# Patient Record
Sex: Female | Born: 1995
Health system: Southern US, Community
[De-identification: ages and names within clinical notes are randomized; demographics above are authoritative.]

## PROBLEM LIST (undated history)

## (undated) DIAGNOSIS — Z9109 Other allergy status, other than to drugs and biological substances: Secondary | ICD-10-CM

## (undated) DIAGNOSIS — J020 Streptococcal pharyngitis: Secondary | ICD-10-CM

## (undated) HISTORY — PX: WISDOM TOOTH EXTRACTION: SHX21

---
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
## 2017-02-21 ENCOUNTER — Ambulatory Visit
Admission: EM | Admit: 2017-02-21 | Discharge: 2017-02-21 | Disposition: A | Discharge status: Home / Self Care | Payer: Medicaid Other | Attending: Family Medicine | Admitting: Family Medicine

## 2017-02-21 ENCOUNTER — Ambulatory Visit: Payer: Medicaid Other

## 2017-02-21 DIAGNOSIS — Z7722 Contact with and (suspected) exposure to environmental tobacco smoke (acute) (chronic): Secondary | ICD-10-CM | POA: Diagnosis not present

## 2017-02-21 DIAGNOSIS — S6710XA Crushing injury of unspecified finger(s), initial encounter: Secondary | ICD-10-CM | POA: Diagnosis not present

## 2017-02-21 DIAGNOSIS — W230XXA Caught, crushed, jammed, or pinched between moving objects, initial encounter: Secondary | ICD-10-CM | POA: Insufficient documentation

## 2017-02-21 DIAGNOSIS — Z79899 Other long term (current) drug therapy: Secondary | ICD-10-CM | POA: Diagnosis not present

## 2017-02-21 DIAGNOSIS — Z91013 Allergy to seafood: Secondary | ICD-10-CM | POA: Diagnosis not present

## 2017-02-21 DIAGNOSIS — S6992XA Unspecified injury of left wrist, hand and finger(s), initial encounter: Secondary | ICD-10-CM | POA: Diagnosis not present

## 2017-02-21 DIAGNOSIS — Z888 Allergy status to other drugs, medicaments and biological substances status: Secondary | ICD-10-CM | POA: Diagnosis not present

## 2017-02-21 NOTE — ED Triage Notes (Signed)
Pt reports left hand 1st finger was knicked yesterday with a car door. Able to move finger but with pain. No significant swelling noted, no open area noted. Pain 5/10
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
## 2017-04-30 ENCOUNTER — Ambulatory Visit
Admission: EM | Admit: 2017-04-30 | Discharge: 2017-04-30 | Disposition: A | Discharge status: Home / Self Care | Payer: Medicaid Other | Attending: Family Medicine | Admitting: Family Medicine

## 2017-04-30 ENCOUNTER — Encounter: Payer: Self-pay | Admitting: Gynecology

## 2017-04-30 DIAGNOSIS — R05 Cough: Secondary | ICD-10-CM

## 2017-04-30 DIAGNOSIS — J019 Acute sinusitis, unspecified: Secondary | ICD-10-CM | POA: Diagnosis not present

## 2017-04-30 MED ORDER — FEXOFENADINE-PSEUDOEPHED ER 180-240 MG PO TB24: 1.0000 | ORAL_TABLET | Freq: Every day | ORAL | 0 refills | Status: DC

## 2017-04-30 MED ORDER — AMOXICILLIN-POT CLAVULANATE 875-125 MG PO TABS: 1.0000 | ORAL_TABLET | Freq: Two times a day (BID) | ORAL | 0 refills | Status: DC

## 2017-04-30 NOTE — ED Triage Notes (Signed)
Patient c/o sinus pressure x 3 weeks. Patient also stated have eaten a sandwich x yesterday which upset her stomach .

## 2017-04-30 NOTE — ED Provider Notes (Signed)
[facility]    CSN: [id number] Arrival date & time: 04/30/17  1000     History   Chief Complaint Chief Complaint  Patient presents with  . Facial Pain    HPI Jodi Berry is a 21 y.o. female.   Patient's here because of 3 week history of nasal congestion coughing. She states green mucus is coming from her nose now. Past history she's that was into the extraction she's had no drug allergies but she is allergic to shrimp bee strigs she has lactose intolerance. She states she's having some abdominal discomfort as well but she does admit to drinking a [place] yesterday while she is tall prescription she thought she needed some caffeine. Past smoker history nonsignificant she does not smoke though she is exposed to smoke from her mother. No pertinent family medical history relevant for today's visit.   The history is provided by the patient. No language interpreter was used.    Past Medical History:  Diagnosis Date  . Environmental allergies   . Strep throat     There are no active problems to display for this patient.   Past Surgical History:  Procedure Laterality Date  . WISDOM TOOTH EXTRACTION      OB History    No data available       Home Medications    Prior to Admission medications   Medication Sig Start Date End Date Taking? Authorizing Provider  amoxicillin-clavulanate (AUGMENTIN) 875-125 MG tablet Take 1 tablet by mouth 2 (two) times daily. 04/30/17   Hassan RowanWade, Iley Deignan, MD  fexofenadine-pseudoephedrine (ALLEGRA-D ALLERGY & CONGESTION) 180-240 MG 24 hr tablet Take 1 tablet by mouth daily. 04/30/17   Hassan RowanWade, Jak Haggar, MD  loratadine (CLARITIN) 10 MG tablet Take 10 mg by mouth daily.    [provider]  oseltamivir (TAMIFLU) 75 MG capsule Take 1 capsule (75 mg total) by mouth 2 (two) times daily. 11/14/16   Payton Mccallumonty, [place], MD    Family History No family history on file.  Social History Social History  Substance Use Topics  . Smoking status: Passive  Smoke Exposure - Never Smoker  . Smokeless tobacco: Never Used  . Alcohol use No     Allergies   Bee venom and Shrimp [shellfish allergy]   Review of Systems Review of Systems  HENT: Positive for congestion, rhinorrhea, sinus pain and sinus pressure.   Respiratory: Positive for cough.   Gastrointestinal: Positive for abdominal distention.  Hematological: Adenopathy:    All other systems reviewed and are negative.    Physical Exam Triage Vital Signs ED Triage Vitals  Enc Vitals Group     BP 04/30/17 1027 111/62     Pulse Rate 04/30/17 1027 65     Resp 04/30/17 1027 16     Temp 04/30/17 1027 98.5 F (36.9 C)     Temp Source 04/30/17 1027 Oral     SpO2 04/30/17 1027 100 %     Weight 04/30/17 1024 178 lb (80.7 kg)     Height 04/30/17 1024 5' (1.524 m)     Head Circumference --      Peak Flow --      Pain Score 04/30/17 1025 2     Pain Loc --      Pain Edu? --      Excl. in GC? --    No data found.   Updated Vital Signs BP 111/62 (BP Location: Left Arm)   Pulse 65   Temp 98.5 F (36.9 C) (Oral)  Resp 16   Ht 5' (1.524 m)   Wt 178 lb (80.7 kg)   LMP 04/23/2017   SpO2 100%   BMI 34.76 kg/m   Visual Acuity Right Eye Distance:   Left Eye Distance:   Bilateral Distance:    Right Eye Near:   Left Eye Near:    Bilateral Near:     Physical Exam  Constitutional: She is oriented to person, place, and time. She appears well-developed and well-nourished.  HENT:  Head: Normocephalic and atraumatic.  Right Ear: External ear normal.  Left Ear: External ear normal.  Eyes: Pupils are equal, round, and reactive to light.  Neck: Normal range of motion. Neck supple. No thyromegaly present.  Cardiovascular: Normal rate and regular rhythm.   Pulmonary/Chest: Effort normal.  Musculoskeletal: Normal range of motion.  Neurological: She is alert and oriented to person, place, and time.  Skin: Skin is warm. She is not diaphoretic.  Psychiatric: She has a normal mood  and affect.  Vitals reviewed.    UC Treatments / Results  Labs (all labs ordered are listed, but only abnormal results are displayed) Labs Reviewed - No data to display  EKG  EKG Interpretation None       Radiology No results found.  Procedures Procedures (including critical care time)  Medications Ordered in UC Medications - No data to display   Initial Impression / Assessment and Plan / UC Course  I have reviewed the triage vital signs and the nursing notes.  Pertinent labs & imaging results that were available during my care of the patient were reviewed by me and considered in my medical decision making (see chart for details).     Since she admits to drinking milk and she is lactose intolerance we'll can do anything about the GI discomfort she is having now other than avoiding milk in the future. For the sinusitis we'll treat with Augmentin 875 one tablet twice daily Allegra-D 1 tablet daily. She needs no distention of medication she goes back to work and that was done as well Final Clinical Impressions(s) / UC Diagnoses   Final diagnoses:  Acute non-recurrent sinusitis, unspecified location    New Prescriptions New Prescriptions   AMOXICILLIN-CLAVULANATE (AUGMENTIN) 875-125 MG TABLET    Take 1 tablet by mouth 2 (two) times daily.   FEXOFENADINE-PSEUDOEPHEDRINE (ALLEGRA-D ALLERGY & CONGESTION) 180-240 MG 24 HR TABLET    Take 1 tablet by mouth daily.     Hassan Rowan, MD 04/30/17 [phone]

## 2017-12-11 ENCOUNTER — Other Ambulatory Visit: Payer: Self-pay

## 2017-12-11 ENCOUNTER — Ambulatory Visit
Admission: EM | Admit: 2017-12-11 | Discharge: 2017-12-11 | Disposition: A | Discharge status: Home / Self Care | Payer: 59 | Attending: Family Medicine | Admitting: Family Medicine

## 2017-12-11 DIAGNOSIS — R110 Nausea: Secondary | ICD-10-CM | POA: Diagnosis not present

## 2017-12-11 DIAGNOSIS — R197 Diarrhea, unspecified: Secondary | ICD-10-CM | POA: Diagnosis not present

## 2017-12-11 LAB — URINALYSIS, COMPLETE (UACMP) WITH MICROSCOPIC
BILIRUBIN URINE: NEGATIVE
Glucose, UA: NEGATIVE mg/dL
Hgb urine dipstick: NEGATIVE
Ketones, ur: NEGATIVE mg/dL
Nitrite: NEGATIVE
PROTEIN: NEGATIVE mg/dL
Specific Gravity, Urine: 1.02 (ref 1.005–1.030)
pH: 7 (ref 5.0–8.0)

## 2017-12-11 LAB — PREGNANCY, URINE: Preg Test, Ur: NEGATIVE

## 2017-12-11 MED ORDER — ONDANSETRON 4 MG PO TBDP: 4.0000 mg | ORAL_TABLET | Freq: Three times a day (TID) | ORAL | 0 refills | Status: DC | PRN

## 2017-12-11 MED ORDER — METRONIDAZOLE 500 MG PO TABS: 2000.0000 mg | ORAL_TABLET | Freq: Every day | ORAL | 0 refills | Status: DC

## 2017-12-11 NOTE — ED Provider Notes (Signed)
[facility]  CSN: [id number] Arrival date & time: 12/11/17  0947  History   Chief Complaint Chief Complaint  Patient presents with  . Nausea  . Diarrhea   HPI  22 year old female presents with nausea, diarrhea, abdominal pain.  Started on Saturday.  She states that she has had epigastric/left upper quadrant pain.  She subsequently developed nausea and diarrhea on Sunday.  Associated dizziness.  No fevers or chills.  She is been able to eat but feels like she has had a decreased appetite.  She ate a gravy biscuit this morning without difficulty.  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  No known exacerbating relieving factors.  No reports of urinary symptoms.  She states that her last menses was at the end of last month.  No other associated symptoms.  No other complaints at this time.  Past Medical History:  Diagnosis Date  . Environmental allergies   . Strep throat    Past Surgical History:  Procedure Laterality Date  . WISDOM TOOTH EXTRACTION     OB History    No data available     Home Medications    Prior to Admission medications   Medication Sig Start Date End Date Taking? Authorizing Provider  metroNIDAZOLE (FLAGYL) 500 MG tablet Take 4 tablets (2,000 mg total) by mouth daily. 12/11/17   Tommie Samsook, Demauri Advincula G, DO  ondansetron (ZOFRAN-ODT) 4 MG disintegrating tablet Take 1 tablet (4 mg total) by mouth every 8 (eight) hours as needed for nausea or vomiting. 12/11/17   Tommie Samsook, Jazline Cumbee G, DO   Family History History reviewed. No pertinent family history.  Social History Social History   Tobacco Use  . Smoking status: Passive Smoke Exposure - Never Smoker  . Smokeless tobacco: Never Used  Substance Use Topics  . Alcohol use: No  . Drug use: No     Allergies   Bee venom and Shrimp [shellfish allergy]   Review of Systems Review of Systems  Constitutional: Negative.   Gastrointestinal: Positive for abdominal pain, diarrhea and nausea. Negative for vomiting.   Physical  Exam Triage Vital Signs ED Triage Vitals  Enc Vitals Group     BP 12/11/17 1001 (!) 144/64     Pulse Rate 12/11/17 1001 88     Resp 12/11/17 1001 18     Temp 12/11/17 1001 98.6 F (37 C)     Temp Source 12/11/17 1001 Oral     SpO2 12/11/17 1001 100 %     Weight 12/11/17 1002 184 lb (83.5 kg)     Height 12/11/17 1002 5' (1.524 m)     Head Circumference --      Peak Flow --      Pain Score 12/11/17 1002 4     Pain Loc --      Pain Edu? --      Excl. in GC? --    Updated Vital Signs BP (!) 144/64 (BP Location: Left Arm)   Pulse 88   Temp 98.6 F (37 C) (Oral)   Resp 18   Ht 5' (1.524 m)   Wt 184 lb (83.5 kg)   LMP 11/14/2017   SpO2 100%   BMI 35.94 kg/m     Physical Exam  Constitutional: She is oriented to person, place, and time. She appears well-developed. No distress.  Cardiovascular: Normal rate and regular rhythm.  Pulmonary/Chest: Effort normal and breath sounds normal. She has no wheezes. She has no rales.  Abdominal:  Soft, nondistended.  Obese.  Mildly tender in the epigastric region.  Neurological: She is alert and oriented to person, place, and time.  Psychiatric: She has a normal mood and affect. Her behavior is normal.  Nursing note and vitals reviewed.  UC Treatments / Results  Labs (all labs ordered are listed, but only abnormal results are displayed) Labs Reviewed  URINALYSIS, COMPLETE (UACMP) WITH MICROSCOPIC - Abnormal; Notable for the following components:      Result Value   Leukocytes, UA TRACE (*)    Squamous Epithelial / LPF 6-30 (*)    Bacteria, UA RARE (*)    All other components within normal limits  PREGNANCY, URINE    EKG  EKG Interpretation None       Radiology No results found.  Procedures Procedures (including critical care time)  Medications Ordered in UC Medications - No data to display   Initial Impression / Assessment and Plan / UC Course  I have reviewed the triage vital signs and the nursing  notes.  Pertinent labs & imaging results that were available during my care of the patient were reviewed by me and considered in my medical decision making (see chart for details).     22 year old female presents with a likely viral gastrointestinal infection.  Advised supportive care.  Zofran for nausea.  Additionally, patient was noted to have Trichomonas in her urine.  I confirmed this with both my nurse and the laboratory staff.  Nurse indicated that the specimen was her specimen.  I looked at the specimen myself and noted trichomonas.  I discussed this with the patient and she denies being sexually active.  I informed her that this is a sexual transmitted infection.  She continued to deny sexual activity.  Nevertheless, I placed her on Flagyl.  Final Clinical Impressions(s) / UC Diagnoses   Final diagnoses:  Nausea  Diarrhea, unspecified type    ED Discharge Orders        Ordered    metroNIDAZOLE (FLAGYL) 500 MG tablet  Daily     12/11/17 1055    ondansetron (ZOFRAN-ODT) 4 MG disintegrating tablet  Every 8 hours PRN     12/11/17 1055     Controlled Substance Prescriptions [place] Controlled Substance Registry consulted? Not Applicable   Tommie Sams, DO 12/11/17 1109

## 2017-12-11 NOTE — Discharge Instructions (Signed)
Rest, fluids.  Zofran for nausea.  Take the antibiotic as prescribed.  Take care  Dr. Adriana Simasook

## 2017-12-11 NOTE — ED Triage Notes (Signed)
Diarrhea and left sided abd pain started Saturday night. Started feeling nauseated yesterday. No emesis.

## 2017-12-12 ENCOUNTER — Ambulatory Visit (INDEPENDENT_AMBULATORY_CARE_PROVIDER_SITE_OTHER): Payer: 59 | Admitting: Family Medicine

## 2017-12-12 ENCOUNTER — Encounter: Payer: Self-pay | Admitting: Family Medicine

## 2017-12-12 ENCOUNTER — Other Ambulatory Visit
Admission: RE | Admit: 2017-12-12 | Discharge: 2017-12-12 | Disposition: A | Discharge status: Home / Self Care | Payer: 59 | Source: Ambulatory Visit | Attending: Family Medicine | Admitting: Family Medicine

## 2017-12-12 VITALS — BP 112/76 | HR 99 | Resp 16 | Ht 60.0 in | Wt 181.4 lb

## 2017-12-12 DIAGNOSIS — Z7689 Persons encountering health services in other specified circumstances: Secondary | ICD-10-CM | POA: Diagnosis not present

## 2017-12-12 DIAGNOSIS — N898 Other specified noninflammatory disorders of vagina: Secondary | ICD-10-CM

## 2017-12-12 LAB — URINALYSIS, COMPLETE (UACMP) WITH MICROSCOPIC
Bilirubin Urine: NEGATIVE
Glucose, UA: NEGATIVE mg/dL
HGB URINE DIPSTICK: NEGATIVE
Ketones, ur: 15 mg/dL — AB
NITRITE: NEGATIVE
Protein, ur: NEGATIVE mg/dL
RBC / HPF: NONE SEEN RBC/hpf (ref 0–5)
SPECIFIC GRAVITY, URINE: 1.02 (ref 1.005–1.030)
pH: 7 (ref 5.0–8.0)

## 2017-12-12 NOTE — Progress Notes (Signed)
Name: Jodi Berry   MRN: [id number]    DOB: 1996/04/15   Date:12/12/2017       Progress Note  Subjective  Chief Complaint  Chief Complaint  Patient presents with  . Establish Care    Seen at urgent care and dx with STD but is crying and hurt that she is christian who has never had sex and was accused of this by someone who just kept on saying she was not honest about sexual activity. .     Vaginal Discharge  The patient's primary symptoms include a genital odor and vaginal discharge. The patient's pertinent negatives include no genital itching, genital lesions, genital rash, missed menses, pelvic pain or vaginal bleeding. Primary symptoms comment: color brown/dried whitish /wet. This is a new problem. Episode onset: "always been there" The problem occurs constantly. The problem has been unchanged. The patient is experiencing no pain. She is not pregnant. Associated symptoms include diarrhea and nausea. Pertinent negatives include no abdominal pain, back pain, chills, constipation, discolored urine, dysuria, fever, flank pain, frequency, headaches, hematuria, joint pain, rash, sore throat, urgency or vomiting. The vaginal discharge was normal (for patient). There has been no bleeding. She has tried nothing (has metronidazole 2 gm) for the symptoms. She is not sexually active. She uses nothing for contraception.    No problem-specific Assessment & Plan notes found for this encounter.   Past Medical History:  Diagnosis Date  . Environmental allergies   . Strep throat     Past Surgical History:  Procedure Laterality Date  . WISDOM TOOTH EXTRACTION      History reviewed. No pertinent family history.  Social History   Socioeconomic History  . Marital status: Single    Spouse name: Not on file  . Number of children: Not on file  . Years of education: Not on file  . Highest education level: Not on file  Social Needs  . Financial resource strain: Not on file  . Food insecurity -  worry: Not on file  . Food insecurity - inability: Not on file  . Transportation needs - medical: Not on file  . Transportation needs - non-medical: Not on file  Occupational History  . Not on file  Tobacco Use  . Smoking status: Passive Smoke Exposure - Never Smoker  . Smokeless tobacco: Never Used  Substance and Sexual Activity  . Alcohol use: No  . Drug use: No  . Sexual activity: Not on file  Other Topics Concern  . Not on file  Social History Narrative  . Not on file    Allergies  Allergen Reactions  . Bee Venom Swelling  . Shrimp [Shellfish Allergy] Swelling    Outpatient Medications Prior to Visit  Medication Sig Dispense Refill  . ondansetron (ZOFRAN-ODT) 4 MG disintegrating tablet Take 1 tablet (4 mg total) by mouth every 8 (eight) hours as needed for nausea or vomiting. 20 tablet 0  . metroNIDAZOLE (FLAGYL) 500 MG tablet Take 4 tablets (2,000 mg total) by mouth daily. (Patient not taking: Reported on 12/12/2017) 4 tablet 0   No facility-administered medications prior to visit.     Review of Systems  Constitutional: Negative for chills, fever, malaise/fatigue and weight loss.  HENT: Negative for ear discharge, ear pain and sore throat.   Eyes: Negative for blurred vision.  Respiratory: Negative for cough, sputum production, shortness of breath and wheezing.   Cardiovascular: Negative for chest pain, palpitations and leg swelling.  Gastrointestinal: Positive for diarrhea and nausea. Negative for abdominal  pain, blood in stool, constipation, heartburn, melena and vomiting.  Genitourinary: Positive for vaginal discharge. Negative for dysuria, flank pain, frequency, hematuria, missed menses, pelvic pain and urgency.  Musculoskeletal: Negative for back pain, joint pain, myalgias and neck pain.  Skin: Negative for rash.  Neurological: Negative for dizziness, tingling, sensory change, focal weakness and headaches.  Endo/Heme/Allergies: Negative for environmental allergies  and polydipsia. Does not bruise/bleed easily.  Psychiatric/Behavioral: Negative for depression and suicidal ideas. The patient is not nervous/anxious and does not have insomnia.      Objective  Vitals:   12/12/17 1454  BP: 112/76  Pulse: 99  Resp: 16  SpO2: 99%  Weight: 181 lb 6.4 oz (82.3 kg)  Height: 5' (1.524 m)    Physical Exam  Constitutional: She is well-developed, well-nourished, and in no distress. No distress.  HENT:  Head: Normocephalic and atraumatic.  Right Ear: External ear normal.  Left Ear: External ear normal.  Nose: Nose normal.  Mouth/Throat: Oropharynx is clear and moist.  Eyes: Conjunctivae and EOM are normal. Pupils are equal, round, and reactive to light. Right eye exhibits no discharge. Left eye exhibits no discharge.  Neck: Normal range of motion. Neck supple. No JVD present. No thyromegaly present.  Cardiovascular: Normal rate, regular rhythm, normal heart sounds and intact distal pulses. Exam reveals no gallop and no friction rub.  No murmur heard. Pulmonary/Chest: Effort normal and breath sounds normal. She has no wheezes. She has no rales.  Abdominal: Soft. Bowel sounds are normal. She exhibits no mass. There is no hepatosplenomegaly. There is tenderness in the suprapubic area. There is no guarding and no CVA tenderness.  Musculoskeletal: Normal range of motion. She exhibits no edema.  Lymphadenopathy:    She has no cervical adenopathy.  Neurological: She is alert.  Skin: Skin is warm and dry. She is not diaphoretic.  Psychiatric: Mood and affect normal.  Nursing note and vitals reviewed.     Assessment & Plan  Problem List Items Addressed This Visit    None    Visit Diagnoses    Establishing care with new doctor, encounter for    -  Primary   Vaginal discharge       u/a with microscopy down stair lab/confirmed/ take medication      No orders of the defined types were placed in this encounter.     Dr. Hayden Rasmusseneanna Jones [facility]  12/12/17

## 2018-02-19 ENCOUNTER — Ambulatory Visit: Payer: 59 | Admitting: Family Medicine

## 2018-02-21 ENCOUNTER — Encounter: Payer: Self-pay | Admitting: Family Medicine

## 2018-02-21 ENCOUNTER — Ambulatory Visit (INDEPENDENT_AMBULATORY_CARE_PROVIDER_SITE_OTHER): Payer: 59 | Admitting: Family Medicine

## 2018-02-21 VITALS — BP 120/80 | HR 80 | Ht 60.0 in | Wt 182.0 lb

## 2018-02-21 DIAGNOSIS — Z020 Encounter for examination for admission to educational institution: Secondary | ICD-10-CM | POA: Diagnosis not present

## 2018-02-21 DIAGNOSIS — Z111 Encounter for screening for respiratory tuberculosis: Secondary | ICD-10-CM | POA: Diagnosis not present

## 2018-02-21 DIAGNOSIS — Z23 Encounter for immunization: Secondary | ICD-10-CM | POA: Diagnosis not present

## 2018-02-21 DIAGNOSIS — Z1159 Encounter for screening for other viral diseases: Secondary | ICD-10-CM

## 2018-02-21 DIAGNOSIS — L910 Hypertrophic scar: Secondary | ICD-10-CM | POA: Diagnosis not present

## 2018-02-21 NOTE — Progress Notes (Signed)
Name: Jodi Berry   MRN: [id number]    DOB: 1995/12/08   Date:02/21/2018       Progress Note  Subjective  Chief Complaint  Chief Complaint  Patient presents with  . Annual Exam    needs Hep B titer, tDAP and TB skin test placement    Patient presents for exam prior to school admission.   No problem-specific Assessment & Plan notes found for this encounter.   Past Medical History:  Diagnosis Date  . Environmental allergies   . Strep throat     Past Surgical History:  Procedure Laterality Date  . WISDOM TOOTH EXTRACTION      No family history on file.  Social History   Socioeconomic History  . Marital status: Single    Spouse name: Not on file  . Number of children: Not on file  . Years of education: Not on file  . Highest education level: Not on file  Occupational History  . Not on file  Social Needs  . Financial resource strain: Not on file  . Food insecurity:    Worry: Not on file    Inability: Not on file  . Transportation needs:    Medical: Not on file    Non-medical: Not on file  Tobacco Use  . Smoking status: Passive Smoke Exposure - Never Smoker  . Smokeless tobacco: Never Used  Substance and Sexual Activity  . Alcohol use: No  . Drug use: No  . Sexual activity: Not on file  Lifestyle  . Physical activity:    Days per week: Not on file    Minutes per session: Not on file  . Stress: Not on file  Relationships  . Social connections:    Talks on phone: Not on file    Gets together: Not on file    Attends religious service: Not on file    Active member of club or organization: Not on file    Attends meetings of clubs or organizations: Not on file    Relationship status: Not on file  . Intimate partner violence:    Fear of current or ex partner: Not on file    Emotionally abused: Not on file    Physically abused: Not on file    Forced sexual activity: Not on file  Other Topics Concern  . Not on file  Social History Narrative  . Not on file     Allergies  Allergen Reactions  . Bee Venom Swelling  . Shrimp [Shellfish Allergy] Swelling    Outpatient Medications Prior to Visit  Medication Sig Dispense Refill  . metroNIDAZOLE (FLAGYL) 500 MG tablet Take 4 tablets (2,000 mg total) by mouth daily. (Patient not taking: Reported on 12/12/2017) 4 tablet 0  . ondansetron (ZOFRAN-ODT) 4 MG disintegrating tablet Take 1 tablet (4 mg total) by mouth every 8 (eight) hours as needed for nausea or vomiting. 20 tablet 0   No facility-administered medications prior to visit.     Review of Systems  Constitutional: Negative for chills, fever, malaise/fatigue and weight loss.  HENT: Negative for ear discharge, ear pain and sore throat.   Eyes: Negative for blurred vision.  Respiratory: Negative for cough, sputum production, shortness of breath and wheezing.   Cardiovascular: Negative for chest pain, palpitations and leg swelling.  Gastrointestinal: Negative for abdominal pain, blood in stool, constipation, diarrhea, heartburn, melena and nausea.  Genitourinary: Negative for dysuria, frequency, hematuria and urgency.  Musculoskeletal: Negative for back pain, joint pain, myalgias and neck pain.  Skin: Negative for rash.  Neurological: Negative for dizziness, tingling, sensory change, focal weakness and headaches.  Endo/Heme/Allergies: Negative for environmental allergies and polydipsia. Does not bruise/bleed easily.  Psychiatric/Behavioral: Negative for depression and suicidal ideas. The patient is not nervous/anxious and does not have insomnia.      Objective  Vitals:   02/21/18 0925  BP: 120/80  Pulse: 80  Weight: 182 lb (82.6 kg)  Height: 5' (1.524 m)    Physical Exam  Constitutional: She is oriented to person, place, and time. She appears well-developed and well-nourished.  HENT:  Head: Normocephalic.  Right Ear: Tympanic membrane, external ear and ear canal normal.  Left Ear: Tympanic membrane, external ear and ear canal  normal.  Nose: Nose normal.  Mouth/Throat: Uvula is midline and oropharynx is clear and moist. No oropharyngeal exudate, posterior oropharyngeal edema or posterior oropharyngeal erythema.  Eyes: Pupils are equal, round, and reactive to light. Conjunctivae and EOM are normal. Lids are everted and swept, no foreign bodies found. Left eye exhibits no hordeolum. No foreign body present in the left eye. Right conjunctiva is not injected. Left conjunctiva is not injected. No scleral icterus.  Neck: Normal range of motion. Neck supple. Normal carotid pulses, no hepatojugular reflux and no JVD present. Carotid bruit is not present. No tracheal deviation present. No thyromegaly present.  Cardiovascular: Normal rate, regular rhythm, S1 normal, S2 normal, normal heart sounds and intact distal pulses. Exam reveals no gallop, no S3, no S4 and no friction rub.  No murmur heard. Pulses:      Carotid pulses are 2+ on the right side, and 2+ on the left side.      Radial pulses are 2+ on the right side, and 2+ on the left side.       Femoral pulses are 2+ on the right side, and 2+ on the left side.      Popliteal pulses are 2+ on the right side, and 2+ on the left side.       Dorsalis pedis pulses are 2+ on the right side, and 2+ on the left side.       Posterior tibial pulses are 2+ on the right side, and 2+ on the left side.  Pulmonary/Chest: Effort normal and breath sounds normal. No respiratory distress. She has no decreased breath sounds. She has no wheezes. She has no rhonchi. She has no rales.  Abdominal: Soft. Normal aorta. She exhibits no mass. Bowel sounds are decreased. There is no hepatosplenomegaly. There is no tenderness. There is no rebound and no guarding.  Musculoskeletal: Normal range of motion. She exhibits no edema or tenderness.  Lymphadenopathy:       Head (right side): No submandibular adenopathy present.       Head (left side): No submandibular adenopathy present.    She has no cervical  adenopathy.  Neurological: She is alert and oriented to person, place, and time. She has normal strength. She displays normal reflexes. No cranial nerve deficit or sensory deficit.  Reflex Scores:      Tricep reflexes are 2+ on the right side and 2+ on the left side.      Bicep reflexes are 2+ on the right side and 2+ on the left side.      Brachioradialis reflexes are 2+ on the right side and 2+ on the left side.      Patellar reflexes are 2+ on the right side and 2+ on the left side.      Achilles reflexes  are 2+ on the right side and 2+ on the left side. Skin: Skin is warm. No rash noted.  keloid  Psychiatric: She has a normal mood and affect. Her mood appears not anxious. She does not exhibit a depressed mood.  Nursing note and vitals reviewed.     Assessment & Plan  Problem List Items Addressed This Visit    None    Visit Diagnoses    Need for Tdap vaccination    -  Primary   Relevant Orders   Tdap vaccine greater than or equal to 7yo IM (Completed)   Screening-pulmonary TB       Relevant Orders   PPD   Need for hepatitis B screening test       Relevant Orders   Hepatitis B surface antibody      No orders of the defined types were placed in this encounter.     Dr. Hayden [facility]  02/21/18

## 2018-02-23 ENCOUNTER — Other Ambulatory Visit: Payer: Self-pay

## 2018-02-23 LAB — TB SKIN TEST
Induration: 0 mm
TB Skin Test: NEGATIVE

## 2018-02-27 DIAGNOSIS — Z1159 Encounter for screening for other viral diseases: Secondary | ICD-10-CM | POA: Diagnosis not present

## 2018-02-28 LAB — HEPATITIS B SURFACE ANTIBODY,QUALITATIVE: Hep B Surface Ab, Qual: NONREACTIVE

## 2018-03-02 ENCOUNTER — Ambulatory Visit (INDEPENDENT_AMBULATORY_CARE_PROVIDER_SITE_OTHER): Payer: 59

## 2018-03-02 DIAGNOSIS — Z23 Encounter for immunization: Secondary | ICD-10-CM | POA: Diagnosis not present

## 2018-03-02 MED ORDER — HEPATITIS B VAC RECOMBINANT 10 MCG/ML IJ SUSP
1.0000 mL | Freq: Once | INTRAMUSCULAR | Status: AC
  Administered 2018-03-02: 10 ug via INTRAMUSCULAR

## 2018-04-02 ENCOUNTER — Ambulatory Visit: Payer: 59

## 2018-04-05 ENCOUNTER — Ambulatory Visit (INDEPENDENT_AMBULATORY_CARE_PROVIDER_SITE_OTHER): Payer: 59

## 2018-04-05 DIAGNOSIS — Z23 Encounter for immunization: Secondary | ICD-10-CM | POA: Diagnosis not present

## 2018-04-05 MED ORDER — HEPATITIS B VAC RECOMBINANT 10 MCG/ML IJ SUSP
1.0000 mL | Freq: Once | INTRAMUSCULAR | Status: AC
  Administered 2018-04-05: 10 ug via INTRAMUSCULAR

## 2018-09-05 ENCOUNTER — Ambulatory Visit: Payer: 59

## 2018-09-05 ENCOUNTER — Ambulatory Visit (INDEPENDENT_AMBULATORY_CARE_PROVIDER_SITE_OTHER): Payer: 59

## 2018-09-05 DIAGNOSIS — Z23 Encounter for immunization: Secondary | ICD-10-CM | POA: Diagnosis not present

## 2018-09-06 ENCOUNTER — Ambulatory Visit: Payer: 59

## 2020-01-27 ENCOUNTER — Ambulatory Visit: Payer: 59 | Attending: Internal Medicine

## 2020-01-27 DIAGNOSIS — Z23 Encounter for immunization: Secondary | ICD-10-CM

## 2020-01-27 NOTE — Progress Notes (Signed)
   Covid-19 Vaccination Clinic  Name:  Bellamie Turney    MRN: [id number] DOB: 07-19-1996  01/27/2020  Ms. Danielski was observed post Covid-19 immunization for 15 minutes without incident. She was provided with Vaccine Information Sheet and instruction to access the V-Safe system.   Ms. Marchiano was instructed to call 911 with any severe reactions post vaccine: Marland Kitchen Difficulty breathing  . Swelling of face and throat  . A fast heartbeat  . A bad rash all over body  . Dizziness and weakness   Immunizations Administered    Name Date Dose VIS Date Route   Pfizer COVID-19 Vaccine 01/27/2020 10:34 AM 0.3 mL 10/04/2019 Intramuscular   Manufacturer: [employer], [employer]   Lot: [id number]   NDC: [address]-3

## 2020-02-18 ENCOUNTER — Ambulatory Visit: Payer: 59 | Attending: Internal Medicine

## 2020-02-18 DIAGNOSIS — Z23 Encounter for immunization: Secondary | ICD-10-CM

## 2020-02-18 NOTE — Progress Notes (Signed)
   Covid-19 Vaccination Clinic  Name:  Jodi Berry    MRN: [id number] DOB: July 31, 1996  02/18/2020  Jodi Berry was observed post Covid-19 immunization for 15 minutes without incident. She was provided with Vaccine Information Sheet and instruction to access the V-Safe system.   Jodi Berry was instructed to call 911 with any severe reactions post vaccine: Marland Kitchen Difficulty breathing  . Swelling of face and throat  . A fast heartbeat  . A bad rash all over body  . Dizziness and weakness   Immunizations Administered    Name Date Dose VIS Date Route   Pfizer COVID-19 Vaccine 02/18/2020  1:26 PM 0.3 mL 12/18/2018 Intramuscular   Manufacturer: [employer], [employer]   Lot: [id number]   NDC: [address]-4

## 2020-02-19 ENCOUNTER — Ambulatory Visit: Payer: 59

## 2021-06-30 ENCOUNTER — Encounter: Payer: Self-pay | Admitting: Family Medicine

## 2022-10-14 ENCOUNTER — Other Ambulatory Visit: Payer: Self-pay

## 2022-10-14 ENCOUNTER — Emergency Department
Admission: EM | Admit: 2022-10-14 | Discharge: 2022-10-14 | Disposition: A | Discharge status: Home / Self Care | Payer: No Typology Code available for payment source | Attending: Emergency Medicine | Admitting: Emergency Medicine

## 2022-10-14 ENCOUNTER — Emergency Department: Payer: No Typology Code available for payment source

## 2022-10-14 DIAGNOSIS — M79641 Pain in right hand: Secondary | ICD-10-CM | POA: Insufficient documentation

## 2022-10-14 DIAGNOSIS — Y92410 Unspecified street and highway as the place of occurrence of the external cause: Secondary | ICD-10-CM | POA: Insufficient documentation

## 2022-10-14 MED ORDER — MELOXICAM 15 MG PO TABS: 15.0000 mg | ORAL_TABLET | Freq: Every day | ORAL | 1 refills | Status: AC

## 2022-10-14 MED ORDER — METHOCARBAMOL 500 MG PO TABS: 500.0000 mg | ORAL_TABLET | Freq: Three times a day (TID) | ORAL | 0 refills | Status: AC | PRN
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
## 2022-10-14 NOTE — Discharge Instructions (Signed)
Take Meloxicam and Robaxin as directed.  
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
